# Patient Record
Sex: Female | Born: 1935 | Hispanic: No | Marital: Married
Health system: Southern US, Community
[De-identification: ages and names within clinical notes are randomized; demographics above are authoritative.]

---

## 1998-02-06 ENCOUNTER — Other Ambulatory Visit: Admission: RE | Admit: 1998-02-06 | Discharge: 1998-02-06 | Payer: Self-pay | Admitting: Cardiology

## 1998-03-08 ENCOUNTER — Encounter: Admission: RE | Admit: 1998-03-08 | Discharge: 1998-06-06 | Payer: Self-pay | Admitting: Anesthesiology

## 1999-07-09 ENCOUNTER — Other Ambulatory Visit: Admission: RE | Admit: 1999-07-09 | Discharge: 1999-07-09 | Payer: Self-pay | Admitting: Podiatry

## 2000-04-09 ENCOUNTER — Other Ambulatory Visit: Admission: RE | Admit: 2000-04-09 | Discharge: 2000-04-09 | Payer: Self-pay | Admitting: Gastroenterology

## 2000-12-21 ENCOUNTER — Inpatient Hospital Stay (HOSPITAL_COMMUNITY): Admission: EM | Admit: 2000-12-21 | Discharge: 2000-12-24 | Payer: Self-pay | Admitting: Emergency Medicine

## 2000-12-21 ENCOUNTER — Encounter: Payer: Self-pay | Admitting: Emergency Medicine

## 2000-12-21 ENCOUNTER — Encounter: Payer: Self-pay | Admitting: Neurology

## 2000-12-21 ENCOUNTER — Encounter: Payer: Self-pay | Admitting: Cardiology

## 2005-08-10 ENCOUNTER — Encounter: Admission: RE | Admit: 2005-08-10 | Discharge: 2005-08-10 | Payer: Self-pay | Admitting: Neurology

## 2006-04-11 ENCOUNTER — Emergency Department (HOSPITAL_COMMUNITY): Admission: EM | Admit: 2006-04-11 | Discharge: 2006-04-11 | Payer: Self-pay | Admitting: Emergency Medicine

## 2007-02-21 IMAGING — CR DG CHEST 1V PORT
1 series · 1 of 1 positions shown · non-contrast
Comparison: None.
 PORTABLE CHEST - 1 VIEW:

CLINICAL DATA: 84-year-old with fever and back pain.  Hypertension.

[view not recorded]
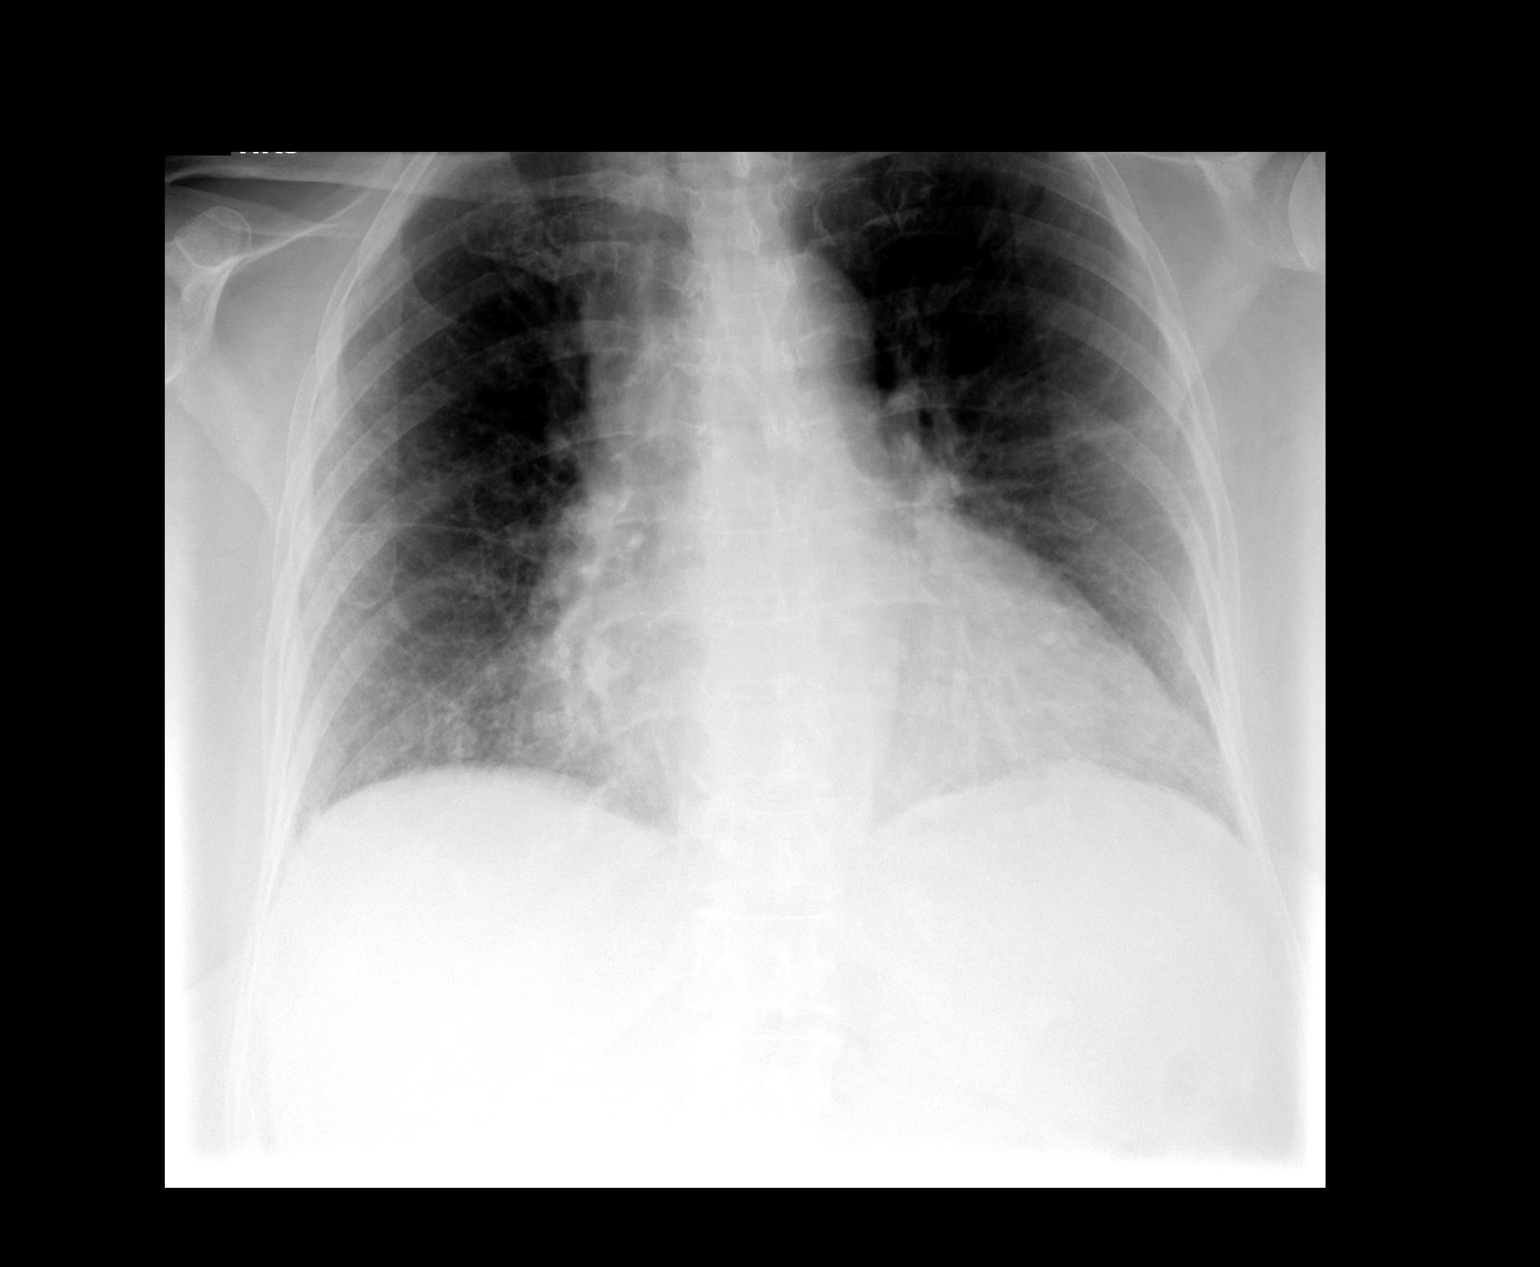

[1 of 1 positions shown; findings below may reference images not displayed]

FINDINGS: The heart is enlarged.  There is prominence of interstitial markings consistent with interstitial edema.  No focal consolidations or pleural effusions are identified.
IMPRESSION: Cardiomegaly and interstitial edema.

## 2014-03-05 DEATH — deceased

## 2014-03-13 ENCOUNTER — Telehealth: Payer: Self-pay

## 2014-03-13 NOTE — Telephone Encounter (Signed)
Patient past away in [place], [place] per Ileene Hutchinson
